# Patient Record
Sex: Male | Born: 1950 | Race: White | Hispanic: No | Marital: Married | Smoking: Never smoker
Health system: Southern US, Community
[De-identification: ages and names within clinical notes are randomized; demographics above are authoritative.]

## PROBLEM LIST (undated history)

## (undated) DIAGNOSIS — C4492 Squamous cell carcinoma of skin, unspecified: Secondary | ICD-10-CM

## (undated) DIAGNOSIS — G1221 Amyotrophic lateral sclerosis: Secondary | ICD-10-CM

## (undated) DIAGNOSIS — I10 Essential (primary) hypertension: Secondary | ICD-10-CM

## (undated) DIAGNOSIS — G709 Myoneural disorder, unspecified: Secondary | ICD-10-CM

## (undated) DIAGNOSIS — R55 Syncope and collapse: Secondary | ICD-10-CM

## (undated) DIAGNOSIS — C4491 Basal cell carcinoma of skin, unspecified: Secondary | ICD-10-CM

## (undated) DIAGNOSIS — I4891 Unspecified atrial fibrillation: Secondary | ICD-10-CM

## (undated) HISTORY — DX: Essential (primary) hypertension: I10

## (undated) HISTORY — PX: FRACTURE SURGERY: SHX138

## (undated) HISTORY — DX: Unspecified atrial fibrillation: I48.91

## (undated) HISTORY — DX: Basal cell carcinoma of skin, unspecified: C44.91

## (undated) HISTORY — DX: Myoneural disorder, unspecified: G70.9

## (undated) HISTORY — DX: Squamous cell carcinoma of skin, unspecified: C44.92

## (undated) HISTORY — DX: Amyotrophic lateral sclerosis: G12.21

## (undated) HISTORY — DX: Syncope and collapse: R55

## (undated) HISTORY — PX: JEJUNOSTOMY FEEDING TUBE: SUR737

---

## 2005-06-28 ENCOUNTER — Ambulatory Visit (HOSPITAL_COMMUNITY): Admission: RE | Admit: 2005-06-28 | Discharge: 2005-06-28 | Payer: Self-pay | Admitting: Orthopedic Surgery

## 2005-06-28 ENCOUNTER — Ambulatory Visit (HOSPITAL_BASED_OUTPATIENT_CLINIC_OR_DEPARTMENT_OTHER): Admission: RE | Admit: 2005-06-28 | Discharge: 2005-06-28 | Payer: Self-pay | Admitting: Orthopedic Surgery

## 2005-08-24 ENCOUNTER — Ambulatory Visit (HOSPITAL_COMMUNITY): Admission: RE | Admit: 2005-08-24 | Discharge: 2005-08-24 | Payer: Self-pay | Admitting: Orthopedic Surgery

## 2008-12-10 ENCOUNTER — Observation Stay (HOSPITAL_COMMUNITY): Admission: EM | Admit: 2008-12-10 | Discharge: 2008-12-12 | Payer: Self-pay | Admitting: Emergency Medicine

## 2008-12-10 ENCOUNTER — Ambulatory Visit: Payer: Self-pay | Admitting: Family Medicine

## 2009-11-15 ENCOUNTER — Ambulatory Visit (HOSPITAL_BASED_OUTPATIENT_CLINIC_OR_DEPARTMENT_OTHER): Admission: RE | Admit: 2009-11-15 | Discharge: 2009-11-15 | Payer: Self-pay | Admitting: Orthopedic Surgery

## 2011-02-19 LAB — POCT I-STAT, CHEM 8
BUN: 17 mg/dL (ref 6–23)
Creatinine, Ser: 0.7 mg/dL (ref 0.4–1.5)
Glucose, Bld: 98 mg/dL (ref 70–99)
Hemoglobin: 16 g/dL (ref 13.0–17.0)
Potassium: 4.1 mEq/L (ref 3.5–5.1)
Sodium: 141 mEq/L (ref 135–145)

## 2011-03-05 LAB — COMPREHENSIVE METABOLIC PANEL
ALT: 23 U/L (ref 0–53)
Alkaline Phosphatase: 86 U/L (ref 39–117)
BUN: 14 mg/dL (ref 6–23)
CO2: 22 mEq/L (ref 19–32)
Chloride: 109 mEq/L (ref 96–112)
GFR calc non Af Amer: >60 mL/min (ref >60)
Glucose, Bld: 95 mg/dL (ref 70–99)
Potassium: 3.7 mEq/L (ref 3.5–5.1)
Sodium: 139 mEq/L (ref 135–145)
Total Bilirubin: 1.1 mg/dL (ref 0.3–1.2)
Total Protein: 7.1 g/dL (ref 6.0–8.3)

## 2011-03-05 LAB — BASIC METABOLIC PANEL
Calcium: 8.5 mg/dL (ref 8.4–10.5)
Creatinine, Ser: 0.91 mg/dL (ref 0.4–1.5)
GFR calc Af Amer: >60 mL/min (ref >60)
Sodium: 135 mEq/L (ref 135–145)

## 2011-03-05 LAB — CBC
HCT: 44.8 % (ref 39.0–52.0)
Hemoglobin: 14.3 g/dL (ref 13.0–17.0)
Hemoglobin: 14.7 g/dL (ref 13.0–17.0)
MCHC: 32.8 g/dL (ref 30.0–36.0)
MCV: 88.3 fL (ref 78.0–100.0)
Platelets: 221 10*3/uL (ref 150–400)
RBC: 5.07 MIL/uL (ref 4.22–5.81)
RDW: 13.8 % (ref 11.5–15.5)
RDW: 14 % (ref 11.5–15.5)
WBC: 6.7 10*3/uL (ref 4.0–10.5)

## 2011-03-05 LAB — CK TOTAL AND CKMB (NOT AT ARMC): CK, MB: 2.8 ng/mL (ref 0.3–4.0)

## 2011-03-05 LAB — LIPID PANEL
Cholesterol: 108 mg/dL (ref 0–200)
LDL Cholesterol: 63 mg/dL (ref 0–99)
Triglycerides: 29 mg/dL (ref <150)

## 2011-03-05 LAB — DIFFERENTIAL
Basophils Absolute: 0.1 10*3/uL (ref 0.0–0.1)
Basophils Relative: 1 % (ref 0–1)
Eosinophils Absolute: 0.1 10*3/uL (ref 0.0–0.7)
Monocytes Relative: 11 % (ref 3–12)
Neutro Abs: 4.2 10*3/uL (ref 1.7–7.7)
Neutrophils Relative %: 58 % (ref 43–77)

## 2011-03-05 LAB — APTT: aPTT: 31 seconds (ref 24–37)

## 2011-03-05 LAB — CARDIAC PANEL(CRET KIN+CKTOT+MB+TROPI)
CK, MB: 2.9 ng/mL (ref 0.3–4.0)
CK, MB: 3.5 ng/mL (ref 0.3–4.0)
Relative Index: 2.4 (ref 0.0–2.5)
Relative Index: 3 — ABNORMAL HIGH (ref 0.0–2.5)
Total CK: 122 U/L (ref 7–232)

## 2011-03-05 LAB — PROTIME-INR
INR: 1.2 (ref 0.00–1.49)
INR: 1.2 (ref 0.00–1.49)
Prothrombin Time: 15.6 seconds — ABNORMAL HIGH (ref 11.6–15.2)

## 2011-03-05 LAB — TROPONIN I: Troponin I: <0.01 ng/mL (ref 0.00–0.06)

## 2011-03-05 LAB — TSH: TSH: 1.581 u[IU]/mL (ref 0.350–4.500)

## 2011-04-03 NOTE — Discharge Summary (Signed)
NAME:  Jake Ellison, Jake Ellison NO.:  [id number]   MEDICAL RECORD NO.:  [id number]          PATIENT TYPE:  OBV   LOCATION:  2041                         FACILITY:  [facility]   PHYSICIAN:  Nestor Ramp, MD        DATE OF BIRTH:  [birth year]   DATE OF ADMISSION:  12/10/2008  DATE OF DISCHARGE:  12/12/2008                               DISCHARGE SUMMARY   PRIMARY CARE Lakyn Mantione:  Jonita Albee, MD, at [facility].   DISCHARGE DIAGNOSES:  1. New-onset atrial fibrillation with rapid ventricular response.  2. Hypertension.   DISCHARGE MEDICATIONS:  1. Cardizem 180 mg 1 tablet by mouth daily.  2. Toprol-XL 25 mg 1 tablet by mouth daily.  3. Lovenox 100 mg injected subcutaneously twice a day for 3 days or      until Coumadin is therapeutic.  4. Coumadin 5 mg 1 tablet by mouth daily.  5. FiberCon 625 mg by mouth daily.   STOP TAKING THESE MEDICINES:  Lisinopril/hydrochlorothiazide.   CONSULTS:  Cardiology.   PROCEDURES:  1. Chest x-ray:  Cardiomegaly.  Atelectasis versus airspace disease of      the left base.  2. EKG on December 10, 2008:  Atrial fibrillation with rapid      ventricular response.  3. EKG on December 12, 2008, normal sinus rhythm.   LABORATORIES:  1. CBC on December 10, 2008:  WBC 7.2, hemoglobin 14.7, and platelets      236.  2. CMP:  Sodium 139, potassium 3.7, chloride 109, CO2 of 22, glucose      95, BUN 14, and creatinine 1.03.  3. Cardiac enzymes negative x3.  4. TSH:  1.581.  5. Free T4 of 1.12.  6. Lipid profile; total cholesterol 108, HDL 39, and LDL 63.  7. INR on December 12, 2008, is 1.2.   BRIEF HOSPITAL COURSE:  The patient is a 60 year old male with a history  of hypertension, who presents with new-onset AFib with RVR.  1. AFib with RVR:  The patient was admitted on December 10, 2008,      because of feelings of dizziness, weakness, and feeling strange.      The patient was found to have new-onset AFib with RVR.  The patient  initially had heart rate greater than 100 beats per minute.  In the      ED, the patient was placed on diltiazem drip.  Cardiology was      consulted, who recommended continuing diltiazem as well as starting      a low-dose beta-blocker.  The patient was transitioned from the      diltiazem drip to Cardizem p.o.  The patient was also started on      Toprol 25 mg p.o. daily.  Since there is unknown duration of atrial      fibrillation, the patient was not immediately cardioverted.  The      patient did convert into normal sinus rhythm on December 11, 2008,      at 3:35 a.m.  The patient remained in sinus rhythm throughout the  rest of his hospital stay.  The patient was also started on      anticoagulation with Lovenox bridging for Coumadin.  The patient's      INR at the time of discharge is 1.2.  The patient had Lovenox and      Coumadin teaching.  The patient was in complete understanding and      agreement with home Lovenox and sufficiently understood the home      dosing.  At the time of discharge, the patient was in normal sinus      rhythm.  He had no complaints of chest pain or any other      complaints.  The patient was agreeable with discharge.  2. Hypertension.  The patient prior to admission had been taking      lisinopril and hydrochlorothiazide.  The patient was started on      Cardizem and metoprolol XL in the hospital.  The patient will be      discharged with Cardizem and metoprolol XL.   DISCHARGE INSTRUCTIONS:  The patient is to increase his activity slowly.  The patient is to have a low-sodium heart-healthy diet.  The patient is  to return to the hospital or seek medical care for any shortness of  breath, dizziness, chest pain, or for any other questions or concerns.   FOLLOWUP:  1. The patient is to return to Dr. Allyson Sabal, cardiologist at [facility], phone number [phone].  The office will      call the patient with an appointment.   2. The patient is to return to Dr. Perrin Maltese at [facility], phone      number [phone] as needed.   DISCHARGE CONDITION:  Stable.      Angelena Sole, MD  Electronically Signed      Nestor Ramp, MD  Electronically Signed    WS/MEDQ  D:  12/12/2008  T:  12/13/2008  Job:  [id number]

## 2011-04-03 NOTE — H&P (Signed)
NAME:  Jake Ellison, Jake Ellison NO.:  [id number]   MEDICAL RECORD NO.:  [id number]          PATIENT TYPE:  OBV   LOCATION:  2041                         FACILITY:  [facility]   PHYSICIAN:  Nestor Ramp, MD        DATE OF BIRTH:  Jan 03, 1951   DATE OF ADMISSION:  12/10/2008  DATE OF DISCHARGE:                              HISTORY & PHYSICAL   PRIMARY CARE PHYSICIAN:  Jonita Albee, MD of [facility].   CODE STATUS:  The patient is a full code.   CHIEF COMPLAINT:  Atrial fibrillation with rapid ventricular response.   HISTORY OF PRESENT ILLNESS:  This is a 60 year old male that comes in  with a AFib and with RVR diagnosed today at [place].  He comes in to the  ED with a story consistent with AFib that started last night.  The  patient says he began having palpitations at 10 p.m. which was 2200,  last night.  He slept well during the night and when he woke up this  morning, he felt mildly dizzy.  When he bent over to feed the dog, he  felt like he was getting ready to pass out, still he decided to go ahead  and go to work and went to [place] this morning for work; however,  around 11 a.m., he started to not feel well, so he turned around and  drove back to [place] to see his PCP, Dr. Perrin Maltese at [facility].  There he was found to have AFib with RVR.  He was given four 81 mg  aspirins at the [facility] and sent over via EMS to the [facility].  The patient has a history of palpitations  since age 43.  He has a history of one-time passing out 3 years ago.  He  does not know if this was due to palpitations.  The patient had  orthostatics done at [facility] that were negative.   ALLERGIES:  His allergies include CODEINE.   MEDICATIONS:  Lisinopril 10 mg p.o. daily and Fibercon 1 tablet p.o.  q.a.m.   PAST MEDICAL HISTORY:  Significant for hypertension, a feeling of any  heart palpitations for 40 years, history of passing out once for about 2  seconds 3 years ago, and a history of being hospitalized once for an E.  coli bladder infection.   PAST SURGICAL HISTORY:  Surgery on his ankle and his left knee.   SOCIAL HISTORY:  The patient lives with his wife and 48 year old  daughter.  He does not smoke tobacco.  He does drink 2 beers a month.  He has no history of drug use.   FAMILY HISTORY:  The patient has a father who is still alive at 29 years  old and doing well.  His mother died at 65 years old, she had Alzheimer.  He has a sister with thyroid problems.   REVIEW OF SYSTEMS:  The patient denies fevers, chills, headache, ear  pain, chest pain, edema, shortness of breath, coughing, nausea,  vomiting, diarrhea, abdominal pain, dysuria,  incontinence, swelling,  visual changes, or petechia.  The patient does have constipation,  palpitations, some weakness, and some dry skin.   PHYSICAL EXAMINATION:  VITAL SIGNS:  Temperature of 98 degrees, pulse  78, respirations 24, blood pressure 126/82, and oxygen of 99% on room  air.  Of note, the patient is on a diltiazem drip.  GENERAL:  The patient is well-appearing, well-nourished, in no acute  distress, sitting on a bed.  HEENT:  The patient is balding.  He is normocephalic, atraumatic.  His  ear canals are clear.  His TMs are not bulging.  He has pupils that are  equal, round, and reactive to light and accommodation.  He has  extraocular movements that are intact.  He had a nasal cannula that was  in his nose, but the oxygen was not flowing, so the patient took it off  during the exam.  He has no rhinorrhea.  There is no erythema in the  back of his throat.  NECK:  Supple.  No thyroid nodules or enlargement.  CARDIOVASCULAR:  The patient has a irregular rhythm, but he has a normal  rate at about 72-74 beats per minute.  No murmurs.  LUNGS:  Clear to auscultation bilaterally.  No crackles or wheezes.  ABDOMEN:  He has positive bowel sounds.  No organomegaly, it is  nontender,  nondistended.  BACK:  No abnormalities.  EXTREMITIES:  Thin.  He has positive pulses in his radial, posterior  tibial, and dorsalis pedis pulse.  His extremities are warm and well  perfused.  NEUROLOGIC:  The patient is neurologically intact.   LABS AND STUDIES.:  CBC is within normal limits with a white blood cell  count of 7.2, hemoglobin of 14.7, hematocrit 44.8, and platelets of 236,  the neutrophil count of 59.  His basic metabolic panel showed a sodium  of 139, a potassium of 3.7, chloride 109, bicarb 22, BUN 14, creatinine  1.03, and glucose of 95.  He has an AST of 26, ALT 23, and albumin of  3.8.  He has cardiac enzymes that were negative x1 those were the point  of care cardiac enzymes.  He has a TSH pending and a T4 pending.  The  patient also had an EKG done down in the ER that showed AFib with  possible left ventricular hypertrophy.  The patient also had a chest x-  ray that showed atelectasis versus airspace disease in the lung base and  cardiomegaly.   ASSESSMENT AND PLAN:  This is a 60 year old male with a history of  hypertension and prior syncopal events that presents with the first  diagnosis of atrial fibrillation with rapid ventricular response.  1. Cardiac atrial fibrillation with rapid ventricular response.  The      patient has documented atrial fibrillation with rapid ventricular      response from [facility] today.  Currently, the patient is      doing well and not feeling palpitations.  He is currently on a      diltiazem drip of 5 mcg.  The plan was to check to Cardiology and      to control the rate of the patient's heart.  In the meantime, the      patient will continue to have cardiac enzymes drawn q.8 h. x3.      Plan to anticoagulate the patient with Lovenox and transition to      Coumadin.  Plan to followup with Cardiology's recommendations.  2.  Hypertension.  The patient has a history of hypertension controlled      on lisinopril 10 mg p.o.  daily.  Plan to get an echo as an      outpatient.  Plan to followup TSH and T4 for possible      hyperthyroidism.  3. Fluids, electrolytes, nutrition and gastrointestinal.  The patient      has normal electrolytes, as seen on his metabolic panel.  The      patient was put on a heart healthy diet.  4. Disposition pending cardiology recommendations, possible discharge      home after his cardiac enzymes come back negative.     Jamie Brookes, MD  Electronically Signed      Nestor Ramp, MD  Electronically Signed   AS/MEDQ  D:  12/10/2008  T:  12/11/2008  Job:  [id number]

## 2011-04-06 NOTE — Op Note (Signed)
NAME:  Jake Ellison, Jake Ellison                ACCOUNT NO.:  [id number]   MEDICAL RECORD NO.:  [id number]          PATIENT TYPE:  AMB   LOCATION:  [facility]                         [facility]ACILITY:  [facility]   PHYSICIAN:  Marlowe Kays, M.D.  DATE OF BIRTH:  May 05, 1951   DATE OF PROCEDURE:  06/28/2005  DATE OF DISCHARGE:                                 OPERATIVE REPORT   PREOPERATIVE DIAGNOSIS:  Torn medial meniscus left knee.   POSTOPERATIVE DIAGNOSES:  1.  Torn medial meniscus.  2.  Chondromalacia medial facette patella left knee.   OPERATION:  Left knee arthroscopy with:  1.  Partial medial meniscectomy.  2.  Shaving of medial facette patella.   SURGEON:  Dr. Simonne Come   ASSISTANT:  Nurse.   ANESTHESIA:  General.   PATHOLOGY AND JUSTIFICATION FOR PROCEDURE:  He has had a long history of  intermittent problems with his left knee when active such as biking.  Recently, he had a flare-up of pain in the inner aspect of his left knee.  It was associated with swelling, leading to my seeing him on June 05, 2005  and an MRI which was performed on July 22, which demonstrated an oblique  tear through the posterior horn of the medial meniscus with some subchondral  edema in the medial tibial plateau.  There was also a Baker's cyst present.  I explained to him that usually the Baker's cyst will resolve once the intra-  articular pathology is taken care of.  We also recommended knee arthroscopy.  See operative description below for additional details.   PROCEDURE:  Satisfactory general anesthesia, pneumatic tourniquet, thigh  stabilizer.  Left leg was esmarched out nonsterilely and prepped with  DuraPrep from stabilizer to ankle with DuraPrep and sterile field in a  sterile field.  In the contralateral right leg, we wrapped with an Ace wrap  and knee support.  Superior and medial saline inflow.  First through an  anterolateral portal, the medial compartment of the knee joint was  evaluated.  In the anterior  compartment, he did have some synovitis present  which I pictured and resected with the 3.5 shaver.  The tear in the  posterior horn of the medial meniscus was not visible on the superior  surface but was extensive on the underneath surface, and there was a small  almost early bucket-handle-type defect, so I could bring the meniscus into  the joint with a probe.  I began resecting the tear back to a stable rim  which actually turned out to be a fairly extensive resection because there  was a lot more torn meniscus than was immediately apparent.  The product was  a stable posterior rim with smooth margins.  There was some minimal wear of  the medial tibial plateau in association with this.  Looking up in the  medial gutter and suprapatellar area, he had some moderate grade 2/4  chondromalacia in the medial facet of the patella which I pictured and  shaved down until smooth with the 3.5 shaver.  I then reversed portals.  Lateral compartment of knee joint looked unremarkable,  and no surgery was  indicated.  The knee joint was then irrigated until clear and all fluid  possible removed.  The two anterior portals were closed with 4-0 nylon.  Through the inflow apparatus, I injected 20 mL of 0.5% Marcaine with  adrenaline but no morphine.  I then closed  this portal 4-0 nylon as well.  Betadine, Adaptic dry sterile dressings were  applied; tourniquet was released.  He tolerated the procedure well and was  taken to the recovery room in satisfactory condition with no known  complications.       JA/MEDQ  D:  06/28/2005  T:  06/28/2005  Job:  [id number]

## 2011-11-30 DIAGNOSIS — R4781 Slurred speech: Secondary | ICD-10-CM | POA: Insufficient documentation

## 2012-02-13 ENCOUNTER — Encounter: Payer: Self-pay | Admitting: Internal Medicine

## 2012-02-20 ENCOUNTER — Ambulatory Visit (INDEPENDENT_AMBULATORY_CARE_PROVIDER_SITE_OTHER): Payer: BC Managed Care – PPO | Admitting: Physician Assistant

## 2012-02-20 DIAGNOSIS — R490 Dysphonia: Secondary | ICD-10-CM

## 2012-02-20 DIAGNOSIS — J309 Allergic rhinitis, unspecified: Secondary | ICD-10-CM | POA: Insufficient documentation

## 2012-02-20 DIAGNOSIS — I10 Essential (primary) hypertension: Secondary | ICD-10-CM | POA: Insufficient documentation

## 2012-02-20 MED ORDER — FLUTICASONE PROPIONATE 50 MCG/ACT NA SUSP: 2.0000 | Freq: Every day | NASAL | Status: AC

## 2012-02-20 MED ORDER — PREDNISONE 20 MG PO TABS: ORAL_TABLET | ORAL | Status: DC

## 2012-02-20 NOTE — Progress Notes (Signed)
Patient ID: Jake Ellison MRN: [id number], DOB: 1951-10-25, 61 y.o. Date of Encounter: 02/20/2012, 12:47 PM  Primary Physician: No primary provider on file.  Chief Complaint: Hoarseness   HPI: 61 y.o. year old male with history below presents with 1 month history of hoarseness, rhinorrhea, and post nasal drip. Prior to the development of his hoarseness he states he was sneezing almost constantly. He does have a history of moderate allergic rhinitis with a usual flare of symptoms this time of year. He has remained afebrile and without a sore throat. His nasal congestion is clear, causing him to blow his nose about every thirty minutes. He denies any cough, sinus pressure, otalgia, and has normal hearing. He has not tried any medications to help. He has never smoked in his life. He states around this time of year he will usually lose his voice for several days due to allergies, but it has never happened for this long. He still has a voice today, it is described as being a little harsher than normal. No hemoptysis or weight loss. Normal appetite. No sick contacts.   Past Medical History  Diagnosis Date  . Allergic rhinitis   . HTN (hypertension)   . Basal cell cancer   . Squamous cell skin cancer      Home Meds: Prior to Admission medications   Medication Sig Start Date End Date Taking? Authorizing Provider  lisinopril-hydrochlorothiazide (PRINZIDE,ZESTORETIC) 10-12.5 MG per tablet Take 1 tablet by mouth daily.   Yes Historical Provider, MD  metoprolol tartrate (LOPRESSOR) 25 MG tablet Take 25 mg by mouth 2 (two) times daily.   Yes Historical Provider, MD  fluticasone (FLONASE) 50 MCG/ACT nasal spray Place 2 sprays into the nose daily. 02/20/12 02/19/13  Izabelle Daus M Lyn Joens, PA-C  predniSONE (DELTASONE) 20 MG tablet 3 PO FOR 2 DAYS, 2 PO FOR 2 DAYS, 1 PO FOR 2 DAYS 02/20/12 03/01/12  Sondra Barges, PA-C    Allergies:  Allergies  Allergen Reactions  . Codeine Other (See Comments)    Hyper acting     History   Social History  . Marital Status: Married    Spouse Name: N/A    Number of Children: N/A  . Years of Education: N/A   Occupational History  . Not on file.   Social History Main Topics  . Smoking status: Never Smoker   . Smokeless tobacco: Not on file  . Alcohol Use: Not on file  . Drug Use: Not on file  . Sexually Active: Not on file   Other Topics Concern  . Not on file   Social History Narrative  . No narrative on file     Review of Systems: Constitutional: negative for chills, fever, night sweats, weight changes, or fatigue  HEENT: negative for vision changes, hearing loss, ST, epistaxis, or sinus pressure Cardiovascular: negative for chest pain or palpitations Respiratory: negative for hemoptysis, wheezing, shortness of breath, or cough Abdominal: negative for abdominal pain, nausea, vomiting, diarrhea, or constipation Dermatological: negative for rash Neurologic: negative for headache, dizziness, or syncope All other systems reviewed and are otherwise negative with the exception to those above and in the HPI.   Physical Exam: Blood pressure 134/80, pulse 62, temperature 98.1 F (36.7 C), temperature source Oral, resp. rate 16, height 6\' 3"  (1.905 m), weight 219 lb (99.338 kg)., Body mass index is 27.37 kg/(m^2). General: Well developed, well nourished, in no acute distress. Head: Normocephalic, atraumatic, eyes without discharge, sclera non-icteric, nares are pale and with  clear discharge. Bilateral auditory canals clear, TM's are without perforation, pearly grey and translucent with reflective cone of light bilaterally. Oral cavity moist, posterior pharynx without exudate, erythema, or peritonsillar abscess. There is a moderate amount of post nasal drip present.  Neck: Supple. No thyromegaly. Full ROM. No lymphadenopathy. Lungs: Clear bilaterally to auscultation without wheezes, rales, or rhonchi. Breathing is unlabored. Heart: RRR with S1 S2. No  murmurs, rubs, or gallops appreciated. Msk:  Strength and tone normal for age. Extremities/Skin: Warm and dry. No clubbing or cyanosis. No edema. No rashes or suspicious lesions. Neuro: Alert and oriented X 3. Moves all extremities spontaneously. Gait is normal. CNII-XII grossly in tact. Psych:  Responds to questions appropriately with a normal affect.     ASSESSMENT AND PLAN:  61 y.o. year old male with hoarseness secondary to allergic rhinitis -Prednisone 20 mg #12 3x2, 2x2, 1x2 no RF SED -Flonase 2 sprays each nare daily #1 no RF -Zyrtec -If symptoms persist call back and we will set up referral to ENT for a scope -RTC precautions  Signed, Eula Listen, PA-C 02/20/2012 12:47 PM

## 2012-02-26 ENCOUNTER — Ambulatory Visit (INDEPENDENT_AMBULATORY_CARE_PROVIDER_SITE_OTHER): Payer: BC Managed Care – PPO | Admitting: Physician Assistant

## 2012-02-26 ENCOUNTER — Encounter: Payer: Self-pay | Admitting: Physician Assistant

## 2012-02-26 ENCOUNTER — Telehealth: Payer: Self-pay

## 2012-02-26 VITALS — BP 128/79 | HR 54 | Temp 98.4°F | Resp 16 | Ht 75.0 in | Wt 221.8 lb

## 2012-02-26 DIAGNOSIS — R498 Other voice and resonance disorders: Secondary | ICD-10-CM

## 2012-02-26 DIAGNOSIS — R490 Dysphonia: Secondary | ICD-10-CM

## 2012-02-26 MED ORDER — PREDNISONE 20 MG PO TABS: 20.0000 mg | ORAL_TABLET | Freq: Two times a day (BID) | ORAL | Status: AC

## 2012-02-26 MED ORDER — OMEPRAZOLE 20 MG PO CPDR: 40.0000 mg | DELAYED_RELEASE_CAPSULE | Freq: Every day | ORAL | Status: AC

## 2012-02-26 NOTE — Progress Notes (Signed)
Patient ID: Jake Ellison MRN: [id number], DOB: [birth year], 61 y.o. Date of Encounter: 02/26/2012, 10:28 AM  Primary Physician: No primary provider on file.  Chief Complaint: Recheck of hoarseness x 1 month  HPI: 61 y.o. year old male with history below presents for follow up of hoarseness. See OV from 02/20/12. He was started on Prednisone 20 mg 3,3,2,2,1,1 taper and Flonase at that time. He states that within 2-3 days of starting the Prednisone he was 80-85% better. Unfortunately he states that 1 day after completing his Prednisone taper the hoarseness returned. He does speak a lot for work, as he is a Medical illustrator. He denies any sore throat, cough, weight loss, congestion, or sinus pressure. He does feel like there is a small amount of post nasal drip that he is able to clear from his throat periodically. He denies any reflux symptoms, although he is frequently clearing his throat.. Never a smoker or tobacco use.    Past Medical History  Diagnosis Date  . Allergic rhinitis   . HTN (hypertension)   . Basal cell cancer   . Squamous cell skin cancer      Home Meds: Prior to Admission medications   Medication Sig Start Date End Date Taking? Authorizing Provider  fluticasone (FLONASE) 50 MCG/ACT nasal spray Place 2 sprays into the nose daily. 02/20/12 02/19/13 Yes Shanasia Ibrahim M Ezel Vallone, PA-C  losartan (COZAAR) 50 MG tablet Take 50 mg by mouth daily.   Yes Historical Provider, MD  metoprolol tartrate (LOPRESSOR) 25 MG tablet Take 25 mg by mouth 2 (two) times daily.   Yes Historical Provider, MD  lisinopril-hydrochlorothiazide (PRINZIDE,ZESTORETIC) 10-12.5 MG per tablet Take 1 tablet by mouth daily.    Historical Provider, MD  predniSONE (DELTASONE) 20 MG tablet 3 PO FOR 2 DAYS, 2 PO FOR 2 DAYS, 1 PO FOR 2 DAYS 02/20/12 03/01/12  Sondra Barges, PA-C    Allergies:  Allergies  Allergen Reactions  . Antihistamines, Chlorpheniramine-Type   . Codeine Other (See Comments)    Hyper acting    History   Social  History  . Marital Status: Married    Spouse Name: N/A    Number of Children: N/A  . Years of Education: N/A   Occupational History  . Not on file.   Social History Main Topics  . Smoking status: Never Smoker   . Smokeless tobacco: Not on file  . Alcohol Use: Not on file  . Drug Use: Not on file  . Sexually Active: Not on file   Other Topics Concern  . Not on file   Social History Narrative  . No narrative on file     Review of Systems: Constitutional: negative for chills, fever, night sweats, weight changes, or fatigue  HEENT: negative for vision changes, hearing loss, congestion, rhinorrhea, ST, epistaxis, or sinus pressure Cardiovascular: negative for chest pain or palpitations Respiratory: negative for hemoptysis, wheezing, shortness of breath, or cough Abdominal: negative for abdominal pain, nausea, vomiting, diarrhea, or constipation Dermatological: negative for rash Neurologic: negative for headache, dizziness, or syncope All other systems reviewed and are otherwise negative with the exception to those above and in the HPI.   Physical Exam: Blood pressure 128/79, pulse 54, temperature 98.4 F (36.9 C), temperature source Oral, resp. rate 16, height 6\' 3"  (1.905 m), weight 221 lb 12.8 oz (100.608 kg)., Body mass index is 27.72 kg/(m^2). General: Well developed, well nourished, in no acute distress. Head: Normocephalic, atraumatic, eyes without discharge, sclera non-icteric, nares are without discharge.  Bilateral auditory canals clear, TM's are without perforation, pearly grey and translucent with reflective cone of light bilaterally. Oral cavity moist, posterior pharynx with mild post nasal drip. No exudate, erythema, peritonsillar abscess, or ulcers. Neck: Supple. No thyromegaly. Full ROM. No lymphadenopathy. Lungs: Clear bilaterally to auscultation without wheezes, rales, or rhonchi. Breathing is unlabored. Heart: RRR with S1 S2. No murmurs, rubs, or gallops  appreciated. Msk:  Strength and tone normal for age. Extremities/Skin: Warm and dry. No clubbing or cyanosis. No edema. No rashes or suspicious lesions. Neuro: Alert and oriented X 3. Moves all extremities spontaneously. Gait is normal. CNII-XII grossly in tact. Psych:  Responds to questions appropriately with a normal affect.   Labs: Results for orders placed in visit on 02/26/12  POCT RAPID STREP A (OFFICE)      Component Value Range   Rapid Strep A Screen Negative  Negative    TC pending  ASSESSMENT AND PLAN:  61 y.o. year old male with hoarseness -ENT referral  -Trial of Omeprazole 40 mg 1 po daily #30 RF 1 -Continue Flonase -If no better in 3 days restart Prednisone 20 mg #10 2 po daily no RF SED -RTC precautions  Signed, Eula Listen, PA-C 02/26/2012 10:28 AM

## 2012-02-28 NOTE — Progress Notes (Signed)
Quick Note:  Await final results.  Eula Listen, PA-C 02/28/2012 10:06 AM ______

## 2012-06-09 ENCOUNTER — Ambulatory Visit: Payer: BC Managed Care – PPO | Admitting: Physician Assistant

## 2012-06-12 ENCOUNTER — Ambulatory Visit (INDEPENDENT_AMBULATORY_CARE_PROVIDER_SITE_OTHER): Payer: BC Managed Care – PPO | Admitting: Family Medicine

## 2012-06-12 ENCOUNTER — Encounter: Payer: Self-pay | Admitting: Family Medicine

## 2012-06-12 VITALS — BP 130/90 | HR 66 | Temp 98.4°F | Resp 16 | Ht 75.0 in | Wt 217.6 lb

## 2012-06-12 DIAGNOSIS — R490 Dysphonia: Secondary | ICD-10-CM

## 2012-06-12 DIAGNOSIS — J309 Allergic rhinitis, unspecified: Secondary | ICD-10-CM

## 2012-06-12 DIAGNOSIS — R471 Dysarthria and anarthria: Secondary | ICD-10-CM

## 2012-06-12 MED ORDER — MONTELUKAST SODIUM 10 MG PO TABS: 10.0000 mg | ORAL_TABLET | Freq: Every day | ORAL | Status: AC

## 2012-06-15 ENCOUNTER — Encounter: Payer: Self-pay | Admitting: Family Medicine

## 2012-06-15 DIAGNOSIS — R490 Dysphonia: Secondary | ICD-10-CM | POA: Insufficient documentation

## 2012-06-15 NOTE — Progress Notes (Addendum)
  Subjective:    Patient ID: Jake Ellison, male    DOB: Feb 25, 1951, 61 y.o.   MRN: [id number]  HPI  This 61 y.o. Cauc male returns for further evaluation of hoarseness and speech difficulties.  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 He was  treated on 2 occasions here at [facility] and later evaluated for by ENT . That specialist did not A.R.  was the problem and advised evaluation for GERD. The pt has had some improvement of symptoms   with nasal spray but he is not using it nightly. He cannot tolerate anti-histamines. Also, his wife thinks  that he is slurring his speech and is concerned that he may have had a slight stroke during his dental  procedure; pt attributes this to recent dental work (had dental implant placed in lower left side) and  he thinks that his speech has improved since completion of dental work.    Family hx: + ALS (paternal aunt)    Review of Systems  Constitutional: Negative for fever, appetite change, fatigue and unexpected weight change.  HENT: Positive for rhinorrhea, dental problem, voice change and postnasal drip. Negative for hearing loss, ear pain, congestion, sore throat, trouble swallowing and sinus pressure.   Eyes: Negative.   Respiratory: Negative for cough, choking, chest tightness and shortness of breath.   Gastrointestinal: Negative.   Neurological: Negative.        Objective:   Physical Exam  Nursing note and vitals reviewed. Constitutional: He is oriented to person, place, and time. He appears well-developed and well-nourished. No distress.  HENT:  Head: Normocephalic and atraumatic.  Right Ear: Hearing, tympanic membrane, external ear and ear canal normal.  Left Ear: Hearing, tympanic membrane, external ear and ear canal normal.  Nose: Nose normal. No mucosal edema, rhinorrhea, nasal deformity or septal deviation. Right sinus exhibits no maxillary sinus tenderness and no frontal sinus tenderness.  Left sinus exhibits no maxillary sinus tenderness and no frontal sinus tenderness.  Mouth/Throat: Uvula is midline, oropharynx is clear and moist and mucous membranes are normal. No oral lesions. No uvula swelling or dental caries.  Eyes: Conjunctivae and EOM are normal. Pupils are equal, round, and reactive to light. No scleral icterus.  Neck: Normal range of motion. Neck supple. No thyromegaly present.  Cardiovascular: Normal rate, regular rhythm and normal heart sounds.  Exam reveals no gallop and no friction rub.   No murmur heard. Pulmonary/Chest: Effort normal and breath sounds normal. No respiratory distress. He has no wheezes.  Musculoskeletal: Normal range of motion. He exhibits no edema.  Lymphadenopathy:    He has no cervical adenopathy.  Neurological: He is alert and oriented to person, place, and time. He has normal reflexes. A cranial nerve deficit is present. He exhibits normal muscle tone. Coordination normal.       On direct confrontation of CNs: tongue deviates to right of midline (speech- pt displays very mild dysarthria)  Skin: Skin is warm and dry. No rash noted. No erythema.  Psychiatric: He has a normal mood and affect. His behavior is normal. Judgment and thought content normal.          Assessment & Plan:   1. Dysarthria  CT Head Wo Contrast  2. Hoarseness, persistent  Await results of CT  3. Allergic rhinitis - intolerant of anti-histamines Trial RX: Singulair (generic) 10 mg  1 tab every PM Resume Fluticasone hs

## 2012-06-16 ENCOUNTER — Encounter: Payer: Self-pay | Admitting: Family Medicine

## 2012-06-17 ENCOUNTER — Ambulatory Visit
Admission: RE | Admit: 2012-06-17 | Discharge: 2012-06-17 | Disposition: A | Discharge status: Home / Self Care | Payer: Self-pay | Source: Ambulatory Visit | Attending: Family Medicine | Admitting: Family Medicine

## 2012-06-17 ENCOUNTER — Telehealth: Payer: Self-pay

## 2012-06-17 DIAGNOSIS — R471 Dysarthria and anarthria: Secondary | ICD-10-CM

## 2012-06-17 NOTE — Telephone Encounter (Signed)
DR [facility]   PT WANTED TO INFORM YOU HE HAD HIS CTSCAN

## 2012-06-17 NOTE — Telephone Encounter (Signed)
To MD, FYI--report should be headed your way!

## 2012-06-18 NOTE — Telephone Encounter (Signed)
i reviewed the report last PM and will be calling pt today.

## 2012-06-19 ENCOUNTER — Other Ambulatory Visit: Payer: Self-pay | Admitting: Family Medicine

## 2012-06-19 DIAGNOSIS — R471 Dysarthria and anarthria: Secondary | ICD-10-CM

## 2012-06-19 DIAGNOSIS — R490 Dysphonia: Secondary | ICD-10-CM

## 2012-07-29 ENCOUNTER — Ambulatory Visit (INDEPENDENT_AMBULATORY_CARE_PROVIDER_SITE_OTHER): Payer: BC Managed Care – PPO | Admitting: Internal Medicine

## 2012-07-29 VITALS — BP 109/69 | HR 75 | Temp 98.2°F | Resp 16 | Ht 74.75 in | Wt 219.2 lb

## 2012-07-29 DIAGNOSIS — R29898 Other symptoms and signs involving the musculoskeletal system: Secondary | ICD-10-CM

## 2012-07-29 DIAGNOSIS — I4891 Unspecified atrial fibrillation: Secondary | ICD-10-CM | POA: Insufficient documentation

## 2012-07-29 DIAGNOSIS — Z23 Encounter for immunization: Secondary | ICD-10-CM

## 2012-07-29 DIAGNOSIS — R1319 Other dysphagia: Secondary | ICD-10-CM

## 2012-07-29 DIAGNOSIS — IMO0002 Reserved for concepts with insufficient information to code with codable children: Secondary | ICD-10-CM

## 2012-07-29 DIAGNOSIS — Z0000 Encounter for general adult medical examination without abnormal findings: Secondary | ICD-10-CM

## 2012-07-29 LAB — POCT UA - MICROSCOPIC ONLY
Casts, Ur, LPF, POC: NEGATIVE
Crystals, Ur, HPF, POC: NEGATIVE
Yeast, UA: NEGATIVE

## 2012-07-29 LAB — LIPID PANEL
Cholesterol: 127 mg/dL (ref 0–200)
Total CHOL/HDL Ratio: 2.5 Ratio

## 2012-07-29 LAB — POCT CBC
Granulocyte percent: 55.3 %G (ref 37–80)
Lymph, poc: 1.8 (ref 0.6–3.4)
MCH, POC: 29.3 pg (ref 27–31.2)
MCHC: 31.6 g/dL — AB (ref 31.8–35.4)
MCV: 92.8 fL (ref 80–97)
MID (cbc): 0.5 (ref 0–0.9)
POC LYMPH PERCENT: 35.6 %L (ref 10–50)
Platelet Count, POC: 220 10*3/uL (ref 142–424)
RDW, POC: 15.1 %
WBC: 5 10*3/uL (ref 4.6–10.2)

## 2012-07-29 LAB — POCT URINALYSIS DIPSTICK
Blood, UA: NEGATIVE
Nitrite, UA: NEGATIVE
Spec Grav, UA: >=1.03
Urobilinogen, UA: 0.2
pH, UA: 5.5

## 2012-07-29 LAB — PSA: PSA: 2.34 ng/mL (ref <=4.00)

## 2012-07-29 NOTE — Progress Notes (Signed)
  Subjective:    Patient ID: Jake Ellison, male    DOB: 1950-12-08, 61 y.o.   MRN: [id number]  HPI Has chronic speech difficulties, tongue and leg weakness that com es and goes. No sob, doe. Sleeping is different.All sxs started about 4 mo. Ago. 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 He has appt at the ALS center [facility], September 26. Last cpe 5 years ago with me, had financial issues. CPE today See scanned hx   Review of Systems See scanned ros    Objective:   Physical Exam  Vitals reviewed. Constitutional: He is oriented to person, place, and time. He appears well-developed and well-nourished. No distress.  HENT:  Right Ear: External ear normal.  Left Ear: External ear normal.  Nose: Nose normal.  Mouth/Throat: Oropharynx is clear and moist.  Eyes: EOM are normal. Pupils are equal, round, and reactive to light.  Neck: Normal range of motion. Neck supple. No thyromegaly present.  Cardiovascular: Normal rate, regular rhythm and normal heart sounds.   Pulmonary/Chest: Effort normal and breath sounds normal. No respiratory distress.  Abdominal: Soft. Bowel sounds are normal. He exhibits no distension and no mass.  Genitourinary: Rectum normal, prostate normal and penis normal.  Musculoskeletal: Normal range of motion.  Lymphadenopathy:    He has no cervical adenopathy.  Neurological: He is alert and oriented to person, place, and time. He has normal strength. He displays abnormal reflex. He displays no tremor. No sensory deficit. He exhibits normal muscle tone. Coordination normal. He displays no Babinski's sign on the right side. He displays no Babinski's sign on the left side.  Reflex Scores:      Bicep reflexes are 3+ on the right side and 3+ on the left side.      Patellar reflexes are 2+ on the right side and 4+ on the left side.      Achilles reflexes are 2+ on the right side and 2+ on the left side.      Speech is slow and mildly dysarthric  Skin: Skin is warm and  dry.  Psychiatric: He has a normal mood and affect. His behavior is normal. Judgment and thought content normal.    labs      Assessment & Plan:  Reviewed neuro/rheum w/up, 4 MRI reports Possible early ALS, unclear now To [facility]

## 2012-07-30 ENCOUNTER — Encounter: Payer: Self-pay | Admitting: Family Medicine

## 2012-07-30 DIAGNOSIS — R4781 Slurred speech: Secondary | ICD-10-CM

## 2012-08-18 ENCOUNTER — Encounter: Payer: Self-pay | Admitting: Internal Medicine

## 2012-08-18 ENCOUNTER — Ambulatory Visit (INDEPENDENT_AMBULATORY_CARE_PROVIDER_SITE_OTHER): Payer: BC Managed Care – PPO | Admitting: Internal Medicine

## 2012-08-18 VITALS — BP 126/88 | HR 68 | Temp 98.0°F | Resp 18 | Ht 74.0 in | Wt 217.0 lb

## 2012-08-18 DIAGNOSIS — Z719 Counseling, unspecified: Secondary | ICD-10-CM

## 2012-08-18 DIAGNOSIS — G1221 Amyotrophic lateral sclerosis: Secondary | ICD-10-CM

## 2012-08-18 DIAGNOSIS — R4789 Other speech disturbances: Secondary | ICD-10-CM

## 2012-08-18 DIAGNOSIS — Z79899 Other long term (current) drug therapy: Secondary | ICD-10-CM

## 2012-08-18 DIAGNOSIS — Z7189 Other specified counseling: Secondary | ICD-10-CM

## 2012-08-18 DIAGNOSIS — R479 Unspecified speech disturbances: Secondary | ICD-10-CM

## 2012-08-18 NOTE — Progress Notes (Signed)
  Subjective:    Patient ID: Jake Ellison, male    DOB: [birth year], 61 y.o.   MRN: [id number]  HPI Diagnosis of ALS confirmed at [facility] Going to [facility] for [employer] care and f/up Needs speech therapy here Started on 2 neuromuscular meds, see list, epocrates reviewed Will need lft monitoring   Review of Systems     Objective:   Physical Exam  Constitutional: He is oriented to person, place, and time.  HENT:  Nose: Nose normal.  Mouth/Throat: Oropharynx is clear and moist.  Eyes: EOM are normal.  Cardiovascular: Normal rate, regular rhythm and normal heart sounds.   Pulmonary/Chest: Effort normal and breath sounds normal.  Musculoskeletal: Normal range of motion.  Neurological: He is alert and oriented to person, place, and time.  Skin: Skin is warm and dry.  Psychiatric: He has a normal mood and affect.   Slowed speech PFTs from [facility] are low normal     Assessment & Plan:  Going to [facility] neuro RF to speech therapy Help monitor new meds as requested RF to ALS association locally

## 2012-08-18 NOTE — Patient Instructions (Addendum)
Amyotrophic Lateral Sclerosis Amyotrophic Lateral Sclerosis (ALS) is a disease of the nervous system. It is also known as Engineer, agricultural disease. It has no known cause.  SYMPTOMS  ALS shows up (presents) with the loss of nerve cells.  It tends to strike the movement (motor) nerves. These are the nerves that control your muscles.   The nerves used for feeling (sensation) are usually not affected.   With this disease there is continuing (progressive) loss of muscle control and weakness of most muscles. This is most noticeable in the arms and legs.   Later in the disease there is weakness of the facial muscles. Also, there are problems with swallowing and breathing. The voice may also become affected because of problems with weakness.  The thinking ability, movements of the eyes, feeling sensations and circular muscles (sphincters) that help control bowels and urination all continue to work. DIAGNOSIS  The diagnosis of ALS is usually made by observing the physical findings (clinical presentation). X-ray and lab studies may be done to support treatment of possible complications. ALS is a disease with no known cause. At this time, there is no known treatment or cure for the disease. It is progressive. So, the muscle weakness will get worse with time. The disease usually ends with respiratory failure since the body is no longer able to breathe. The muscles which keep you breathing are no longer strong enough to do their job. The muscles which control the rib cage become too weak to provide adequate breathing (respiration).  FOR MORE INFORMATION ALS Association: www.alsa.[facility] of Neurological Disorders and Stroke: http://evans-marshall.biz/ Document Released: 10/30/2001 Document Revised: 10/25/2011 Document Reviewed: 11/05/2005 [facility] Patient Information 2012 [place], [place].

## 2012-08-25 ENCOUNTER — Ambulatory Visit: Payer: BC Managed Care – PPO | Admitting: Physician Assistant

## 2012-08-25 VITALS — BP 124/76 | HR 45 | Temp 98.2°F | Resp 16 | Ht 75.0 in | Wt 221.0 lb

## 2012-08-25 DIAGNOSIS — Z79899 Other long term (current) drug therapy: Secondary | ICD-10-CM

## 2012-08-25 DIAGNOSIS — G1221 Amyotrophic lateral sclerosis: Secondary | ICD-10-CM

## 2012-08-25 LAB — AST: AST: 23 U/L (ref 0–37)

## 2012-08-25 NOTE — Progress Notes (Signed)
  Subjective:    Patient ID: Jake Ellison, male    DOB: 05/04/1951, 61 y.o.   MRN: [id number]  HPI Here s/p visit at [facility] for diagnosis of ALS.  Per Dr. Perrin Maltese, we can monitor and fax his ALT and AST to Dr. Charlyne Petrin. He has been on the new meds X 1 week.   Review of Systems not done     Objective:   Physical Exam  Nursing note and vitals reviewed. Constitutional: He appears well-developed and well-nourished.  Cardiovascular: Normal rate, regular rhythm and normal heart sounds.   Pulmonary/Chest: Effort normal and breath sounds normal.  Skin: Skin is warm and dry.  Psychiatric: He has a normal mood and affect. His behavior is normal.      Assessment & Plan:  ALS-high risk medications Check ALT and AST We will fax results to Dr. Elwanda Brooklyn, M.D. [phone]

## 2012-08-26 ENCOUNTER — Telehealth: Payer: Self-pay | Admitting: Family Medicine

## 2012-08-26 NOTE — Telephone Encounter (Signed)
Spoke with patient and told him that his ALT/AST were both normal. Faxed lab results to Dr. Elwanda Brooklyn in [place] [phone], Fax# [phone]

## 2012-09-10 ENCOUNTER — Encounter: Payer: Self-pay | Admitting: Internal Medicine

## 2013-01-01 IMAGING — CT CT HEAD W/O CM
2 series · 16 of 30 positions shown, 20 images · non-contrast
Comparison: None.

CLINICAL DATA: Slurred speech, tooth extraction and dental implant
in November 2011, hoarseness

CT HEAD WITHOUT CONTRAST
TECHNIQUE: Contiguous axial images were obtained from the base of
the skull through the vertex without contrast.

[Series 2: head w/o · axial · non-contrast · 0.47mm/px · z∈[+14,+152]mm · 13 of 32 slices shown, 17 images]
[im 3/32  brain]
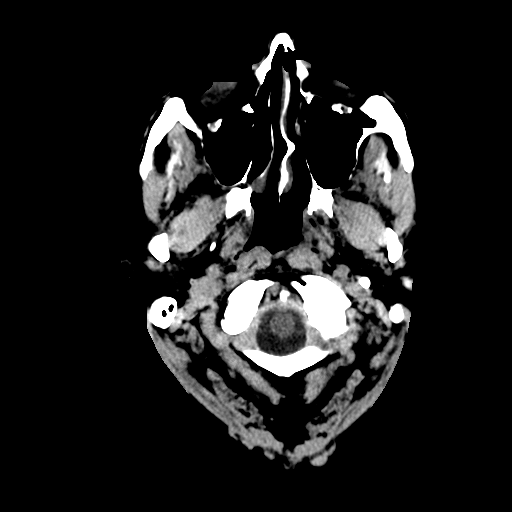
[im 3/32  bone]
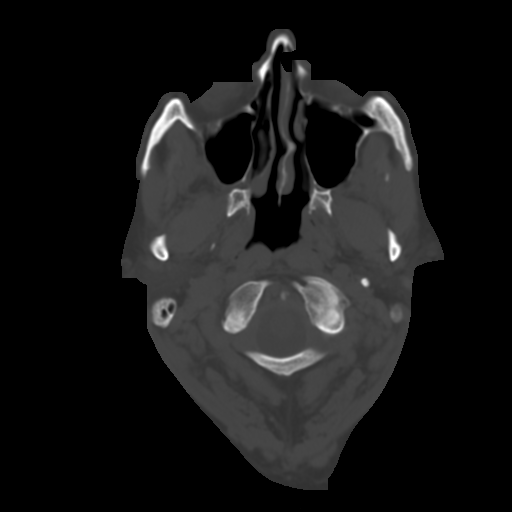
[im 5/32  brain]
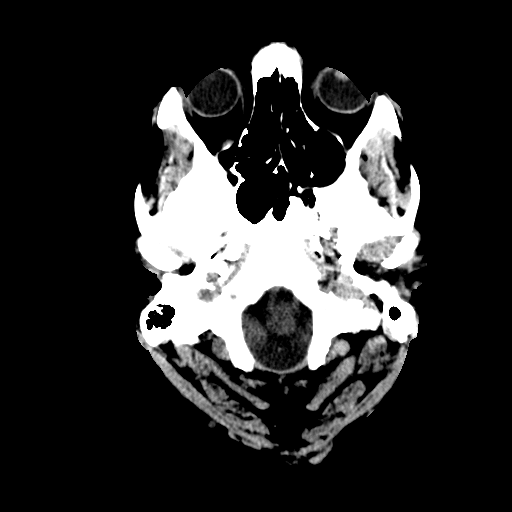
[im 7/32  brain]
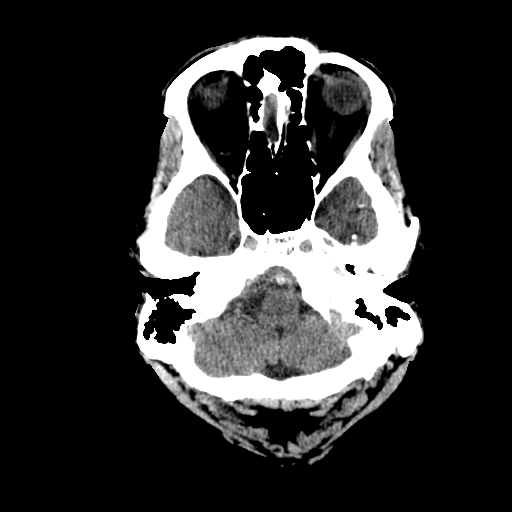
[im 9/32  brain]
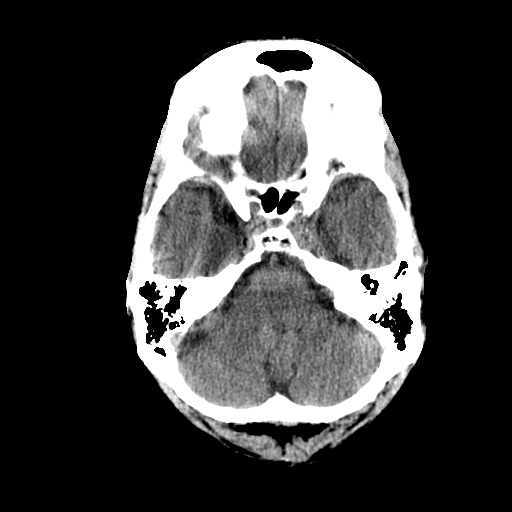
[im 12/32  brain]
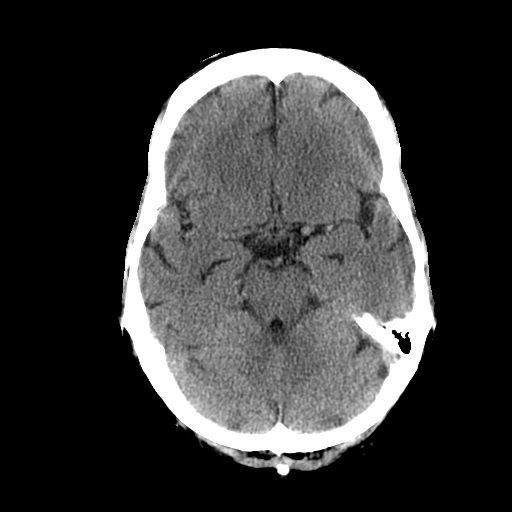
[im 12/32  bone]
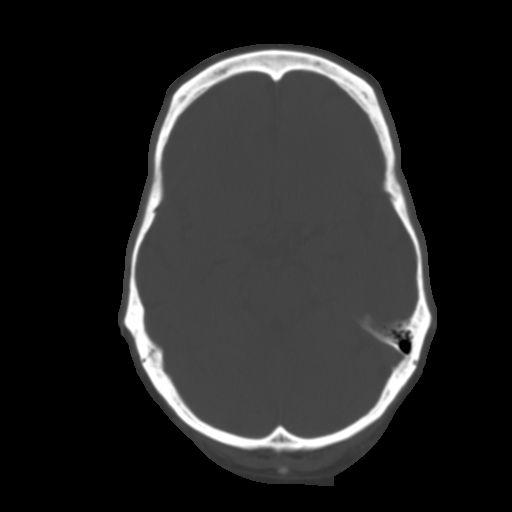
[im 14/32  brain]
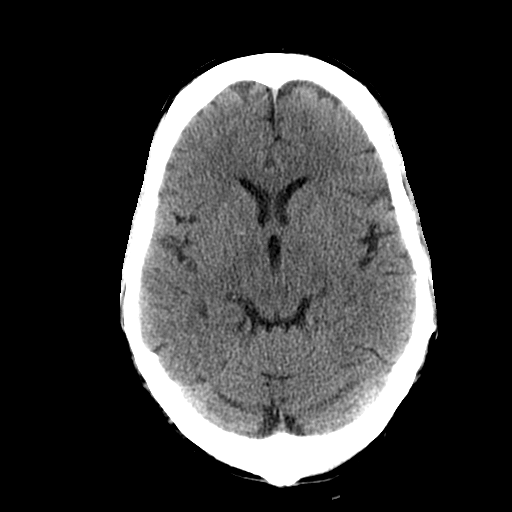
[im 16/32  brain]
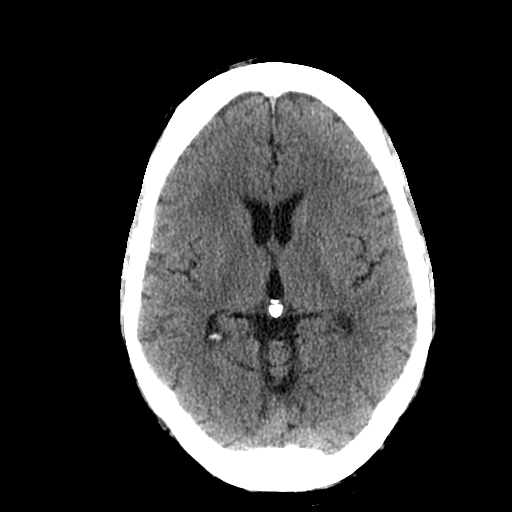
[im 18/32  brain]
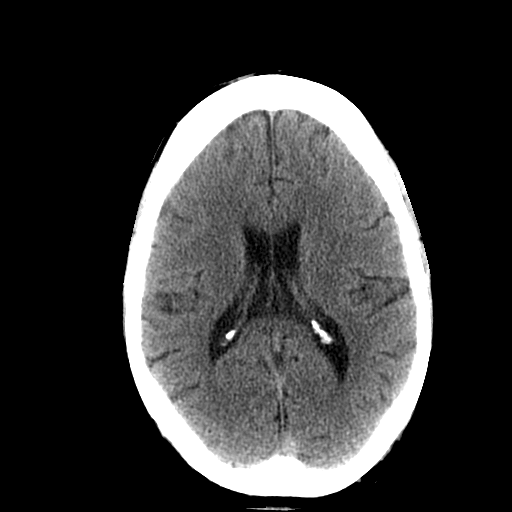
[im 20/32  brain]
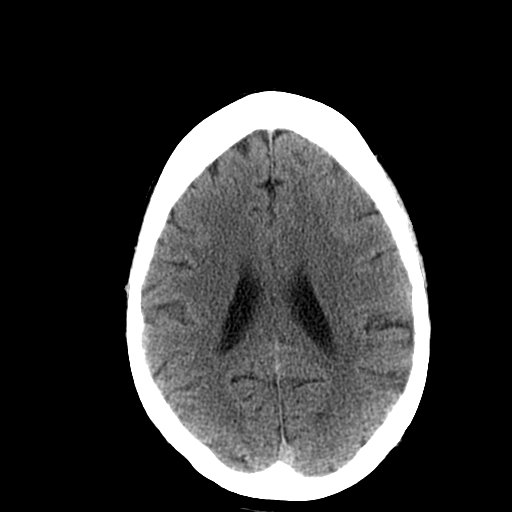
[im 20/32  bone]
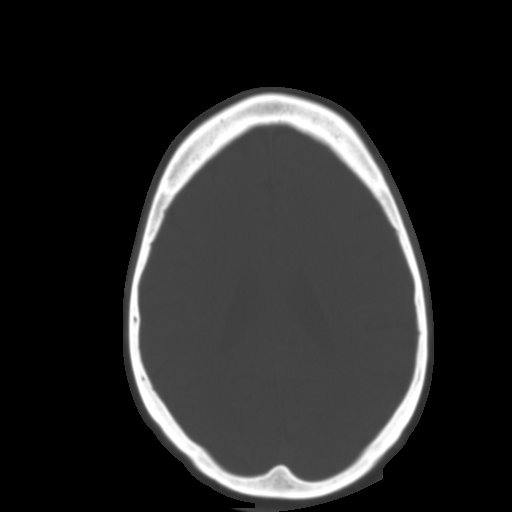
[im 23/32  brain]
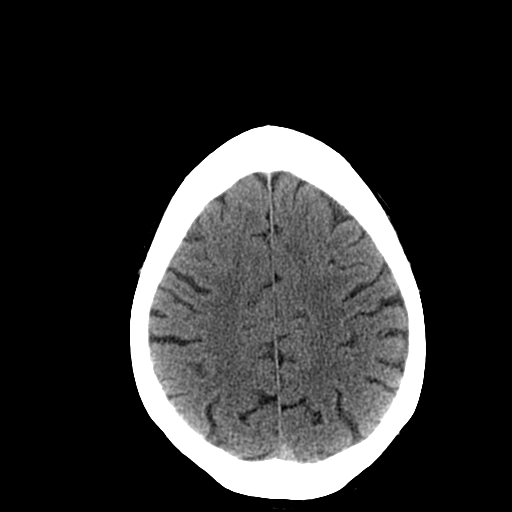
[im 25/32  brain]
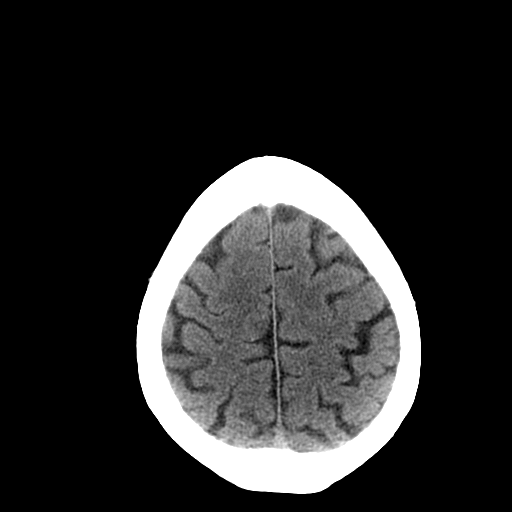
[im 27/32  brain]
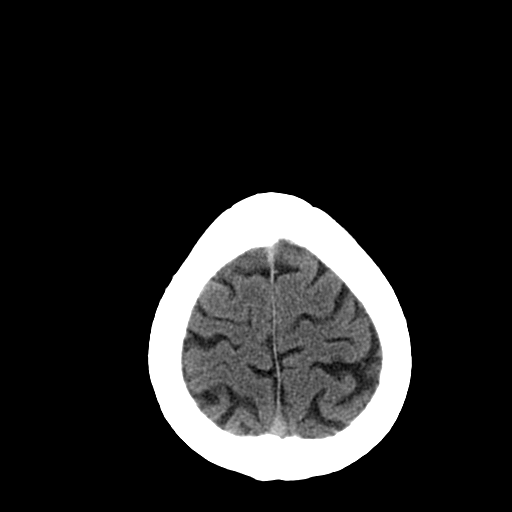
[im 29/32  brain]
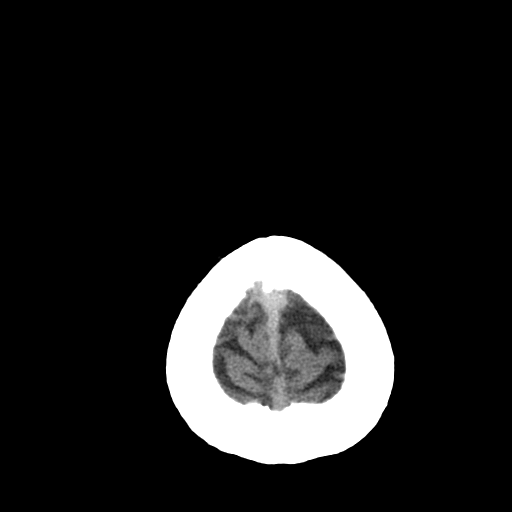
[im 29/32  bone]
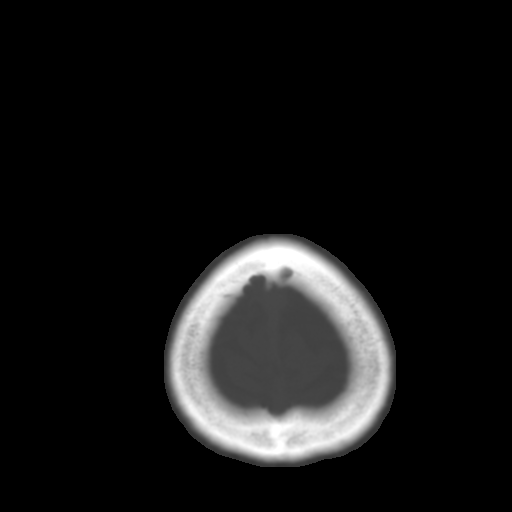

[Series 3: head bone · axial · 0.47mm/px · z∈[+14,+62]mm · 3 of 32 slices shown]
[im 3/32  bone]
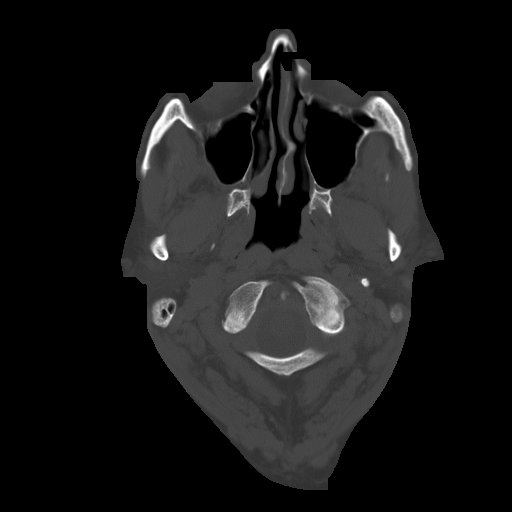
[im 7/32  bone]
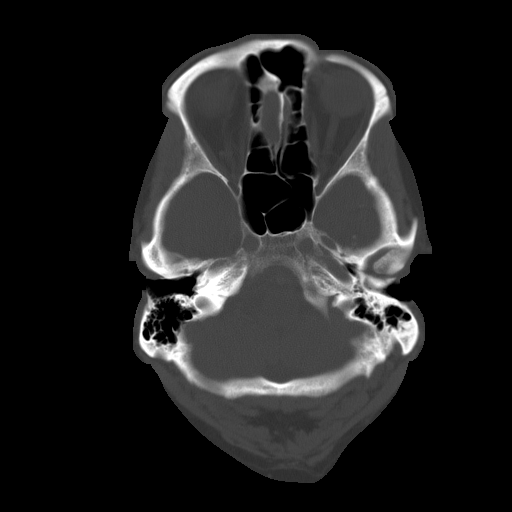
[im 12/32  bone]
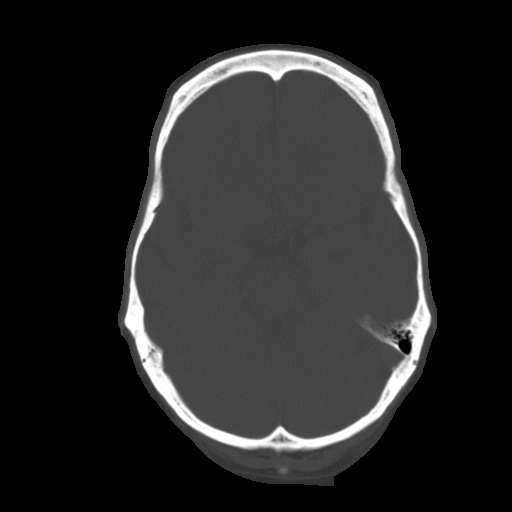

[16 of 30 positions shown; findings below may reference images not displayed]

FINDINGS: The ventricular system is normal in size and
configuration, and the septum is in a normal midline position.  The
fourth ventricle and basilar cisterns appear normal.  No
hemorrhage, mass lesion, or acute infarction is seen.  On bone
window images, no calvarial abnormality is noted.  The paranasal
sinuses that are visualized are pneumatized.
IMPRESSION: Negative unenhanced CT of the brain.

## 2013-05-27 ENCOUNTER — Ambulatory Visit (HOSPITAL_COMMUNITY)
Admission: RE | Admit: 2013-05-27 | Discharge: 2013-05-27 | Disposition: A | Discharge status: Home / Self Care | Payer: BC Managed Care – PPO | Source: Ambulatory Visit | Attending: Family Medicine | Admitting: Family Medicine

## 2013-05-27 ENCOUNTER — Ambulatory Visit (INDEPENDENT_AMBULATORY_CARE_PROVIDER_SITE_OTHER): Payer: BC Managed Care – PPO | Admitting: Family Medicine

## 2013-05-27 VITALS — BP 110/64 | HR 60 | Temp 97.2°F | Resp 18 | Ht 75.0 in | Wt 196.0 lb

## 2013-05-27 DIAGNOSIS — G1221 Amyotrophic lateral sclerosis: Secondary | ICD-10-CM

## 2013-05-27 DIAGNOSIS — M62838 Other muscle spasm: Secondary | ICD-10-CM

## 2013-05-27 DIAGNOSIS — M79609 Pain in unspecified limb: Secondary | ICD-10-CM

## 2013-05-27 DIAGNOSIS — M79604 Pain in right leg: Secondary | ICD-10-CM

## 2013-05-27 MED ORDER — BACLOFEN 10 MG PO TABS: ORAL_TABLET | ORAL | Status: AC

## 2013-05-27 NOTE — Progress Notes (Signed)
VASCULAR LAB PRELIMINARY  PRELIMINARY  PRELIMINARY  PRELIMINARY  Right lower extremity venous duplex completed.    Preliminary report:  Right:  No evidence of DVT, superficial thrombosis, or Baker's cyst.  Peni Rupard, RVS 05/27/2013, 4:17 PM

## 2013-05-27 NOTE — Progress Notes (Signed)
[facility] [address], [place] [address] [phone]  Date:  05/27/2013   Name:  Jake Ellison   DOB:  12-04-1950   MRN:  [id number]  PCP:  No primary provider on file.    Chief Complaint: pain in right leg while sleeping   History of Present Illness:  Jake Ellison is a 62 y.o. very pleasant male patient who presents with the following:  History of ALS, being treated at [facility].  His neurologist is Dr. Alphonzo Dublin.   He has been having some lumbar pain.  Per neurology this has "nothing to do with ALS."   In June he noted onset of right leg pain every night. occuring 2 or 3 hours after falling asleep.  The pain may be a 7/10.  They have tried knee pillows, elevting the leg, cold and heat.  He cannot take narcotics due to allergy.   He had a feeding tube placed last week at [facility], and the SCDs seemed to bother his leg.   He is not able to speak anymore due to the ALS.  They have tried advil, which did not help.   In high school he had a blood clot in the leg- has not recurred.   It is always just the right leg- the pain starts in his leg and radiates up to his back.   No numbness.  No bowel or bladder control issues.    Patient Active Problem List   Diagnosis Date Noted  . 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 Dysphagia, neurologic 07/29/2012  . Atrial fib/flutter, transient 07/29/2012  . Hoarseness, persistent 06/15/2012  . Allergic rhinitis   . HTN (hypertension)   . Slurred speech 11/30/2011    Past Medical History  Diagnosis Date  . Allergic rhinitis   . HTN (hypertension)   . Basal cell cancer   . Squamous cell skin cancer   . Neuromuscular disorder   . ALS (amyotrophic lateral sclerosis)     Past Surgical History  Procedure Laterality Date  . Fracture surgery    . Jejunostomy feeding tube      History  Substance Use Topics  . Smoking status: Never Smoker   . Smokeless tobacco: Not on file  . Alcohol Use: Yes     Comment:  ocas    Family History  Problem Relation Age of Onset  . ALS Paternal Aunt     Allergies  Allergen Reactions  . Antihistamines, Chlorpheniramine-Type   . Codeine Other (See Comments)    Hyper acting    Medication list has been reviewed and updated.  Current Outpatient Prescriptions on File Prior to Visit  Medication Sig Dispense Refill  . losartan (COZAAR) 50 MG tablet Take 50 mg by mouth daily.      . metoprolol tartrate (LOPRESSOR) 25 MG tablet Take 25 mg by mouth daily.       . riluzole (RILUTEK) 50 MG tablet Take 50 mg by mouth 2 (two) times daily.      Marland Kitchen aspirin 81 MG tablet Take 81 mg by mouth daily.      Marland Kitchen Dextromethorphan-Quinidine 20-10 MG CAPS Take by mouth.      . fluticasone (FLONASE) 50 MCG/ACT nasal spray Place 2 sprays into the nose daily.  16 g  6  . lisinopril-hydrochlorothiazide (PRINZIDE,ZESTORETIC) 10-12.5 MG per tablet Take 1 tablet by mouth daily.      . montelukast (SINGULAIR) 10 MG tablet Take 1 tablet (10 mg total) by  mouth at bedtime.  30 tablet  3  . omeprazole (PRILOSEC) 20 MG capsule Take 2 capsules (40 mg total) by mouth daily.  30 capsule  1   No current facility-administered medications on file prior to visit.    Review of Systems:  As per HPI- otherwise negative.   Physical Examination: Filed Vitals:   05/27/13 1252  BP: 110/64  Pulse: 60  Temp: 97.2 F (36.2 C)  Resp: 18   Filed Vitals:   05/27/13 1252  Height: 6\' 3"  (1.905 m)  Weight: 196 lb (88.905 kg)   Body mass index is 24.5 kg/(m^2). Ideal Body Weight: Weight in (lb) to have BMI = 25: 199.6  GEN: WDWN, NAD, Non-toxic, Alert, not able to verbalize but communicates by writing or typing.  His wife helps to give history as well HEENT: Atraumatic, Normocephalic. Neck supple. No masses, No LAD. Ears and Nose: No external deformity. CV: RRR, No M/G/R. No JVD. No thrill. No extra heart sounds. PULM: CTA B, no wheezes, crackles, rhonchi. No retractions. No resp. distress. No  accessory muscle use. EXTR: No c/c/e NEURO  walks slowly with a cane, difficulty rising from a seated position.  PSYCH: Normally interactive. Conversant. Not depressed or anxious appearing.  Calm demeanor.  Right leg: hamstring is very weak, but also spastic and tight.  Able to overcome the hamstring strength but cannot pull leg straight.  He endorses normal sensation in the leg, and DTR is normal.   Left LE is also normal   doppler study; negative for clot.    Assessment and Plan: Leg pain, diffuse, right - Plan: Lower Extremity Venous Duplex Right  Muscle spasm of right leg - Plan: baclofen (LIORESAL) 10 MG tablet  Discussed with his neurologist at [facility], Dr Alphonzo Dublin.  Will try treatment with baclofen for spasms, assuming his doppler is negative.   Received negative doppler study.  Called and discussed with his wife, Leandro Reasoner. We will try a low dose of baclofen.  As he only has sx at night will use just before bed.  Start with 5 mg, go up to 10 if needed.  They will let me know how he is doing.   Signed Abbe Amsterdam, MD

## 2013-05-27 NOTE — Patient Instructions (Addendum)
You go to Admitting 2:30 today at [facility] for your ultrasound. You go into [place] entrance use Valet parking if you can not find a spot. Driving directions to [facility] [id number]  [phone]  - more info    [address]  [place], [place] [address]     1. Head south on [address] toward [address]      0.5 mi    2. Sharp left onto [address]      0.6 mi    3. Turn left onto the [address] ramp      0.2 mi    4. Merge onto [address]      3.0 mi    5. Continue straight to stay on [address] E      0.4 mi    6. Slight left to stay on [address] E      1.2 mi    7. Turn right onto [address]      0.1 mi    8. Turn left onto [address]      361 ft    9. Take the 1st left onto [address] [facility]  Destination will be on the right

## 2013-05-31 ENCOUNTER — Telehealth: Payer: Self-pay

## 2013-05-31 NOTE — Telephone Encounter (Signed)
pts wife, roxanne Sackmann, called and states pt was seen for rt leg pain. Dr copland prescribed a muscle relaxer, per wife this is not helping and they want to know what they can do Please call pt to advise

## 2013-06-01 NOTE — Telephone Encounter (Signed)
Dr Patsy Lager, do you want to Rx something else?

## 2013-06-01 NOTE — Telephone Encounter (Signed)
Called them back- reached his wife's VM (Roxanne).  Left detailed message.   Suggested that they try using 5mg  of baclofen TID, and increasing to 10mg  TID over the course of 1 or 2 weeks.  Call me back if needed

## 2013-06-02 ENCOUNTER — Telehealth: Payer: Self-pay | Admitting: Family Medicine

## 2013-06-02 NOTE — Telephone Encounter (Signed)
Called and LMOM- I wanted to see if the medication change helped with his leg pain.  Let me know if anything else needed.

## 2013-06-05 ENCOUNTER — Telehealth: Payer: Self-pay

## 2013-06-05 NOTE — Telephone Encounter (Signed)
Muscle relaxer's not working for pt. Having trouble sleeping according to wife. Pt has ALS. Pt went downstairs to sit in his lift chair and with his head elevated and legs bent and found relief and was able to sleep very well.   Pt sees dr copland.   Please call to discuss with pt or wife   bf

## 2013-06-05 NOTE — Telephone Encounter (Signed)
Pt's wife reported that pt has not had any relief of R leg pain since increasing Baclofen to TID. She wanted Dr Patsy Lager to know that pt got relief from pain and slept very well when his head was slightly elevated and legs slightly bent in chair in case that would give her any clue as to what could be causing Sxs. Wife also reported that pt has H/O lumbar issues and she wondered if leg pain could be sciatica from that.

## 2013-06-06 NOTE — Telephone Encounter (Signed)
Called back and discussed with Roxanne.  Kroy has slept in his chair again and did well.  I suspect he was more comfortable because the chair keeps his spastic hamstring comfortable.  She might like for him to see ortho (she is a pt at [facility]) but at this point is undecided if the strain of seeing another doctor is worthwhile.  She will let me know if she needs a referral

## 2013-12-07 ENCOUNTER — Telehealth: Payer: Self-pay | Admitting: Cardiovascular Disease

## 2013-12-07 MED ORDER — LOSARTAN POTASSIUM 50 MG PO TABS: 50.0000 mg | ORAL_TABLET | Freq: Every day | ORAL | Status: AC

## 2013-12-07 MED ORDER — METOPROLOL TARTRATE 25 MG PO TABS: 25.0000 mg | ORAL_TABLET | Freq: Every day | ORAL | Status: AC

## 2013-12-07 NOTE — Telephone Encounter (Signed)
Please advise 

## 2013-12-07 NOTE — Telephone Encounter (Signed)
Returned call and pt verified x 2 w/ Roxanne, pt's wife.  Stated they received a letter that pt needs an appt w/ Dr. Gwenlyn Found.  Stated pt was able to walk and everything when he saw Dr. Gwenlyn Found last.  Stated pt is no longer able to walk or talk and is under care w/ Hospice.  Stated he will not be here much longer.  Wife wants to know if pt can get a refill on his BP meds.  Stated they aren't due yet b/c pt gets 90-days and has a few weeks left, plus one refill but she wanted to get ahead of everything since pt cannot come in to the office.  RN asked wife to clarify meds/dosages for pt.  Unsure of names of meds at this time b/c she is driving and does not have information w/ her.  Stated she told the operator the names.    Call to [facility] and informed wife told her pt was taking metoprolol and losartan.  Wife informed and confirmed.  Also informed lisinopril-hctz on med list and wife denied pt is taking that med.  Informed it will be d/c'd from list and that Curt Bears, RN w/ Dr. Gwenlyn Found will be notified in regards to appt and refills.  Verbalized understanding and agreed w/ plan.  Message forwarded to K. Donne Hazel, Therapist, sports.

## 2013-12-07 NOTE — Telephone Encounter (Signed)
Please refill meds as long as Hospice is following regularly.  Jake Ellison 4:16 PM

## 2013-12-07 NOTE — Telephone Encounter (Signed)
Refills sent to pharmacy. 

## 2013-12-07 NOTE — Telephone Encounter (Signed)
He is in late stages of ALS and got a letter regarding making his yearly appt.  She says it is very very difficult to get him there not able to walk or stand.  Please call.  Dr Gwenlyn Found writes 2 of his meds  They will be running out soon.  Please call

## 2013-12-11 ENCOUNTER — Telehealth: Payer: Self-pay | Admitting: Physician Assistant

## 2013-12-11 NOTE — Telephone Encounter (Signed)
Metoprolol should be Toprol XL 25mg  daily.  I left a message for the pharmacy.  Jaiden Dinkins, PA-C

## 2014-03-19 DEATH — deceased

## 2015-05-31 ENCOUNTER — Encounter: Payer: Self-pay | Admitting: *Deleted

## 2015-06-03 ENCOUNTER — Encounter: Payer: Self-pay | Admitting: Cardiovascular Disease

## 2015-06-13 ENCOUNTER — Encounter: Payer: Self-pay | Admitting: Cardiovascular Disease

## 2015-06-14 ENCOUNTER — Encounter: Payer: Self-pay | Admitting: Cardiovascular Disease

## 2015-06-20 ENCOUNTER — Encounter: Payer: Self-pay | Admitting: Cardiovascular Disease
